# Patient Record
Sex: Male | Born: 2005 | Race: Black or African American | Hispanic: No | Marital: Single | Smoking: Never smoker
Health system: Southern US, Community
[De-identification: ages and names within clinical notes are randomized; demographics above are authoritative.]

## PROBLEM LIST (undated history)

## (undated) DIAGNOSIS — T7840XA Allergy, unspecified, initial encounter: Secondary | ICD-10-CM

## (undated) DIAGNOSIS — F909 Attention-deficit hyperactivity disorder, unspecified type: Secondary | ICD-10-CM

## (undated) DIAGNOSIS — F840 Autistic disorder: Secondary | ICD-10-CM

## (undated) DIAGNOSIS — L309 Dermatitis, unspecified: Secondary | ICD-10-CM

## (undated) HISTORY — DX: Dermatitis, unspecified: L30.9

## (undated) HISTORY — DX: Autistic disorder: F84.0

## (undated) HISTORY — DX: Allergy, unspecified, initial encounter: T78.40XA

## (undated) HISTORY — PX: ORCHIOPEXY: SUR915

---
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
## 2023-01-31 ENCOUNTER — Other Ambulatory Visit: Payer: Self-pay | Admitting: Family Medicine

## 2023-02-07 ENCOUNTER — Encounter: Payer: Self-pay | Admitting: Allergy and Immunology

## 2023-02-07 ENCOUNTER — Other Ambulatory Visit: Payer: Self-pay

## 2023-02-07 ENCOUNTER — Ambulatory Visit (INDEPENDENT_AMBULATORY_CARE_PROVIDER_SITE_OTHER): Payer: Medicaid Other | Admitting: Allergy and Immunology

## 2023-02-07 VITALS — BP 126/80 | HR 62 | Temp 98.0°F | Resp 18

## 2023-02-07 DIAGNOSIS — J4540 Moderate persistent asthma, uncomplicated: Secondary | ICD-10-CM

## 2023-02-07 DIAGNOSIS — J301 Allergic rhinitis due to pollen: Secondary | ICD-10-CM

## 2023-02-07 DIAGNOSIS — T781XXD Other adverse food reactions, not elsewhere classified, subsequent encounter: Secondary | ICD-10-CM

## 2023-02-07 DIAGNOSIS — J3089 Other allergic rhinitis: Secondary | ICD-10-CM | POA: Diagnosis not present

## 2023-02-07 DIAGNOSIS — L2089 Other atopic dermatitis: Secondary | ICD-10-CM

## 2023-02-07 MED ORDER — ALBUTEROL SULFATE (2.5 MG/3ML) 0.083% IN NEBU: 2.5000 mg | INHALATION_SOLUTION | RESPIRATORY_TRACT | 1 refills | Status: DC | PRN

## 2023-02-07 MED ORDER — EPIPEN 2-PAK 0.3 MG/0.3ML IJ SOAJ: 0.3000 mg | INTRAMUSCULAR | 1 refills | Status: DC | PRN

## 2023-02-07 MED ORDER — VENTOLIN HFA 108 (90 BASE) MCG/ACT IN AERS: INHALATION_SPRAY | RESPIRATORY_TRACT | 1 refills | Status: DC

## 2023-02-07 MED ORDER — LEVOCETIRIZINE DIHYDROCHLORIDE 5 MG PO TABS: 5.0000 mg | ORAL_TABLET | Freq: Every evening | ORAL | 5 refills | Status: DC

## 2023-02-07 MED ORDER — FLUTICASONE PROPIONATE HFA 110 MCG/ACT IN AERO: INHALATION_SPRAY | RESPIRATORY_TRACT | 5 refills | Status: DC

## 2023-02-07 MED ORDER — SYMBICORT 160-4.5 MCG/ACT IN AERO: 2.0000 | INHALATION_SPRAY | Freq: Two times a day (BID) | RESPIRATORY_TRACT | 5 refills | Status: DC

## 2023-02-07 MED ORDER — MOMETASONE FUROATE 0.1 % EX OINT: TOPICAL_OINTMENT | CUTANEOUS | 5 refills | Status: DC

## 2023-02-07 MED ORDER — FLUTICASONE PROPIONATE 50 MCG/ACT NA SUSP: 1.0000 | Freq: Every day | NASAL | 5 refills | Status: DC

## 2023-02-07 NOTE — Patient Instructions (Addendum)
  1. Continue Symbicort 160 - 2 inhalations 2 times per day    2. Continue Flonase 1-2 sprays each nostril 1 time a day  3. Continue levocetirizine 5 mg tablet 1 tablet 1 time per day  4. If Needed:  A. Pataday 1 drop each eye one time per day  B. Proventil HFA 2 puffs every 4-6 hours  C. Mometasone 0.1% ointment 1-2 times per day  D. EpiPen  5. "Action plan" for asthma flare up:   A. Continue Symbicort 2 times per day  B. Add Flovent 110 - 3 inhalations 3 times per day  C. Use Albuterol HFA if needed  6. Return to clinic in 6 months or sooner if problem

## 2023-02-07 NOTE — Progress Notes (Unsigned)
[place] - [place] - [place]   Follow-up Note  Referring Provider: Saddie Benders, MD Primary Provider: Saddie Benders, MD Date of Office Visit: 02/07/2023  Subjective:   Pedro Tucker (DOB: 2006-01-08) is a 17 y.o. male who returns to the [facility] [place] on 02/07/2023 in re-evaluation of the following:  HPI: Pedro Tucker returns to this clinic in evaluation of asthma, allergic rhinitis, atopic dermatitis, oral allergy syndrome.  I last saw him in this clinic 01 February 2022.  He has been seen by Dr. Posey Pronto on 16 December 2022.  During his last visit he appeared to have some form of secondary cutaneous infection complicating his atopic dermatitis involving for the most part his antecubital fossa for which he used topical mupirocin which resulted in resolution of this issue.  His asthma is doing pretty well.  He uses Symbicort twice a day.  Last week he did have to use a nebulizer with a bronchodilator as he did have some coughing but he has since calmed down regarding that issue.  For the most part he thinks his asthma is under good control.  His nose is stuffy and he has some sneezing Vino uses Flonase daily and takes Xyzal daily.  He is very careful about remaining away from fruits and vegetables that gives rise to oral itching.  Allergies as of 02/07/2023   No Known Allergies      Medication List    albuterol (2.5 MG/3ML) 0.083% nebulizer solution Commonly known as: PROVENTIL Take 3 mLs (2.5 mg total) by nebulization every 4 (four) hours as needed for wheezing or shortness of breath.   albuterol 108 (90 Base) MCG/ACT inhaler Commonly known as: Ventolin HFA Inhale 2 puffs every 4 hours as needed for cough or wheeze   budesonide-formoterol 160-4.5 MCG/ACT inhaler Commonly known as: Symbicort Inhale 2 puffs into the lungs 2 (two) times daily.   clindamycin 1 % Swab Commonly known as: CLEOCIN T Apply 1 each topically 2 (two) times daily.    EpiPen 2-Pak 0.3 mg/0.3 mL Soaj injection Generic drug: EPINEPHrine Inject 0.3 mg into the muscle as needed for anaphylaxis.   fluticasone 110 MCG/ACT inhaler Commonly known as: Flovent HFA Inhale 3 puffs into the lungs 3 (three) times daily. Can inhale three puffs three times daily during asthma flare-up as directed.  Rinse, gargle, and spit after use.   fluticasone 50 MCG/ACT nasal spray Commonly known as: FLONASE Place 2 sprays into both nostrils daily.   hydrocortisone 2.5 % cream Apply topically 2 (two) times daily. Apply twice daily for flare ups above neck, maximum 7 days.   levocetirizine 5 MG tablet Commonly known as: XYZAL Take 1 tablet (5 mg total) by mouth every evening.   montelukast 10 MG tablet Commonly known as: SINGULAIR Take 1 tablet (10 mg total) by mouth daily as needed (Can take an extra dose during flare ups.).   mupirocin ointment 2 % Commonly known as: BACTROBAN Use three times daily for 1-2 weeks.   Olopatadine HCl 0.2 % Soln Commonly known as: Pataday Place 1 drop into both eyes daily as needed (Can use an extra drop during flare ups.).    Past Medical History:  Diagnosis Date   Allergy    Asthma    Attention deficit hyperactivity disorder (ADHD)    Autism spectrum disorder    Eczema     Past Surgical History:  Procedure Laterality Date   ORCHIOPEXY      Review of systems  negative except as noted in HPI / PMHx or noted below:  Review of Systems  Constitutional: Negative.   HENT: Negative.    Eyes: Negative.   Respiratory: Negative.    Cardiovascular: Negative.   Gastrointestinal: Negative.   Genitourinary: Negative.   Musculoskeletal: Negative.   Skin: Negative.   Neurological: Negative.   Endo/Heme/Allergies: Negative.   Psychiatric/Behavioral: Negative.       Objective:   Vitals:   02/07/23 1533  BP: 126/80  Pulse: 62  Resp: 18  Temp: 98 F (36.7 C)  SpO2: 97%          Physical Exam Constitutional:       Appearance: He is not diaphoretic.  HENT:     Head: Normocephalic.     Right Ear: Tympanic membrane, ear canal and external ear normal.     Left Ear: Tympanic membrane, ear canal and external ear normal.     Nose: Mucosal edema present. No rhinorrhea.     Mouth/Throat:     Pharynx: Uvula midline. No oropharyngeal exudate.  Eyes:     Conjunctiva/sclera: Conjunctivae normal.  Neck:     Thyroid: No thyromegaly.     Trachea: Trachea normal. No tracheal tenderness or tracheal deviation.  Cardiovascular:     Rate and Rhythm: Normal rate and regular rhythm.     Heart sounds: Normal heart sounds, S1 normal and S2 normal. No murmur heard. Pulmonary:     Effort: No respiratory distress.     Breath sounds: Normal breath sounds. No stridor. No wheezing or rales.  Lymphadenopathy:     Head:     Right side of head: No tonsillar adenopathy.     Left side of head: No tonsillar adenopathy.     Cervical: No cervical adenopathy.  Skin:    Findings: No erythema or rash.     Nails: There is no clubbing.  Neurological:     Mental Status: He is alert.     Diagnostics:    Spirometry was performed and demonstrated an FEV1 of 3.01 at 72 % of predicted.  The patient had an Asthma Control Test with the following results: ACT Total Score: 19.    Assessment and Plan:   1. Asthma, moderate persistent, well-controlled   2. Perennial allergic rhinitis   3. Seasonal allergic rhinitis due to pollen   4. Other atopic dermatitis   5. Pollen-food allergy, subsequent encounter     1. Continue Symbicort 160 - 2 inhalations 2 times per day    2. Continue Flonase 1-2 sprays each nostril 1 time a day  3. Continue levocetirizine 5 mg tablet 1 tablet 1 time per day  4. If Needed:  A. Pataday 1 drop each eye one time per day  B. Proventil HFA 2 puffs every 4-6 hours  C. Mometasone 0.1% ointment 1-2 times per day  D. EpiPen  5. "Action plan" for asthma flare up:   A. Continue Symbicort 2 times per  day  B. Add Flovent 110 - 3 inhalations 3 times per day  C. Use Albuterol HFA if needed  6. Return to clinic in 6 months or sooner if problem  [place] certainly is having a little bit of problem as he goes through the pollen season which has been his issue for many many years.  We have tried various therapies in the past including immunotherapy and some biologic agents but logistically is hard for him to use those forms of therapy and he is now relying on a combination  of anti-inflammatory agents for his airway and his skin to address his atopic disease.  Assuming he does well with this plan I will see him back in this clinic in 6 months or earlier if there is a problem.  Allena Katz, MD Allergy / Immunology [place]

## 2023-02-08 ENCOUNTER — Encounter: Payer: Self-pay | Admitting: Allergy and Immunology

## 2023-07-20 ENCOUNTER — Other Ambulatory Visit (HOSPITAL_COMMUNITY): Payer: Self-pay

## 2023-07-20 ENCOUNTER — Telehealth: Payer: Self-pay

## 2023-07-21 NOTE — Telephone Encounter (Signed)
Since 2022 nothing has been mentioned about montelukast on any of the AVS' do you want to put pt back on it?

## 2023-07-25 MED ORDER — MONTELUKAST SODIUM 10 MG PO TABS: 10.0000 mg | ORAL_TABLET | Freq: Every day | ORAL | 5 refills | Status: DC

## 2023-07-25 NOTE — Addendum Note (Signed)
Addended by: Berna Bue on: 07/25/2023 09:51 AM   Modules accepted: Orders

## 2023-07-25 NOTE — Telephone Encounter (Signed)
Lm explaining I sent in refill ad they had any other questions to let us know.

## 2023-08-08 ENCOUNTER — Ambulatory Visit: Payer: MEDICAID | Admitting: Allergy and Immunology

## 2023-08-08 ENCOUNTER — Ambulatory Visit: Payer: Medicaid Other | Admitting: Allergy and Immunology

## 2023-08-10 ENCOUNTER — Other Ambulatory Visit: Payer: Self-pay | Admitting: Allergy and Immunology

## 2023-08-17 ENCOUNTER — Other Ambulatory Visit: Payer: Self-pay

## 2023-08-17 MED ORDER — LEVOCETIRIZINE DIHYDROCHLORIDE 5 MG PO TABS: 5.0000 mg | ORAL_TABLET | Freq: Every evening | ORAL | 0 refills | Status: DC

## 2023-08-21 ENCOUNTER — Other Ambulatory Visit: Payer: Self-pay | Admitting: Allergy and Immunology

## 2023-08-22 ENCOUNTER — Encounter: Payer: Self-pay | Admitting: Pediatrics

## 2023-08-22 ENCOUNTER — Ambulatory Visit (INDEPENDENT_AMBULATORY_CARE_PROVIDER_SITE_OTHER): Payer: MEDICAID | Admitting: Pediatrics

## 2023-08-22 VITALS — BP 132/74 | Ht 72.84 in | Wt 215.2 lb

## 2023-08-22 DIAGNOSIS — Z0001 Encounter for general adult medical examination with abnormal findings: Secondary | ICD-10-CM

## 2023-09-05 ENCOUNTER — Encounter: Payer: Self-pay | Admitting: Allergy and Immunology

## 2023-09-05 ENCOUNTER — Ambulatory Visit (INDEPENDENT_AMBULATORY_CARE_PROVIDER_SITE_OTHER): Payer: MEDICAID | Admitting: Allergy and Immunology

## 2023-09-05 VITALS — BP 126/82 | HR 71 | Temp 98.4°F | Resp 16 | Ht 72.25 in | Wt 209.5 lb

## 2023-09-05 DIAGNOSIS — J3089 Other allergic rhinitis: Secondary | ICD-10-CM | POA: Diagnosis not present

## 2023-09-05 DIAGNOSIS — J4540 Moderate persistent asthma, uncomplicated: Secondary | ICD-10-CM | POA: Diagnosis not present

## 2023-09-05 DIAGNOSIS — H1013 Acute atopic conjunctivitis, bilateral: Secondary | ICD-10-CM

## 2023-09-05 DIAGNOSIS — H1010 Acute atopic conjunctivitis, unspecified eye: Secondary | ICD-10-CM

## 2023-09-05 DIAGNOSIS — L738 Other specified follicular disorders: Secondary | ICD-10-CM

## 2023-09-05 DIAGNOSIS — J301 Allergic rhinitis due to pollen: Secondary | ICD-10-CM | POA: Diagnosis not present

## 2023-09-05 DIAGNOSIS — L2089 Other atopic dermatitis: Secondary | ICD-10-CM

## 2023-09-05 NOTE — Patient Instructions (Signed)
  1. Continue Symbicort 160 - 2 inhalations 2 times per day    2. Continue Flonase 1-2 sprays each nostril 1 time a day  3. Continue levocetirizine 5 mg tablet 1 tablet 1 time per day  4. If Needed:  A. Pataday 1 drop each eye one time per day  B. Albuterol + Fluticasone 110 - 2 inhalations TOGETHER every 4-6 hours  C. Mometasone 0.1% ointment 1-2 times per day to body D. Elidel 1-2 times per day to face  5. For under arm dermatitis:   A. Change under arm deodorant  B. Clotrimazole cream applied twice a day for 2 weeks  C. Further evaluation and treatment???  6. Return to clinic in 6 months or sooner if problem

## 2023-09-05 NOTE — Progress Notes (Unsigned)
[place] - [place] - [place] - [place] - [place]   Follow-up Note  Referring Provider: Lucio Edward, MD Primary Provider: Lucio Edward, MD Date of Office Visit: 09/05/2023  Subjective:   Pedro Tucker (DOB: [birth year]) is a 17 y.o. male who [facility]23 in re-evaluation of the following:  HPI: Pedro Tucker returns to this clinic in evaluation of asthma, allergic rhinitis, atopic dermatitis.  I last saw him in this clinic 07 February 2023.  He has had very little problems with his airway.  He uses his Symbicort and Flonase on a consistent basis and has very little problem with wheezing or coughing or the need to use the short acting bronchodilator and he has very little problems with nasal congestion and sneezing and he has not required a systemic steroid or an antibiotic for any type of airway issue.  But what has been an issue is his skin.  He has several different skin problems.  He has been developing some irritation on his forehead and his perinasal area and on his chest that is itchy and he does excoriate this area.  He has run out of his topical steroids.  He has been out of these agents for a month and a half.  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  And he has developed a relatively new dermatitis under his axilla bilaterally and this may correlate with the use of a certain type of arm deodorant.  Allergies as of 09/05/2023   No Known Allergies      Medication List    albuterol (2.5 MG/3ML) 0.083% nebulizer solution Commonly known as: PROVENTIL Take 3 mLs (2.5 mg total) by nebulization every 4 (four) hours as needed for wheezing or shortness of breath.   Ventolin HFA 108 (90 Base) MCG/ACT inhaler Generic drug: albuterol Inhale 2 puffs every 4 hours as needed  for cough or wheeze   clindamycin 1 % Swab Commonly known as: CLEOCIN T Apply 1 each topically 2 (two) times daily.   fluticasone 110 MCG/ACT inhaler Commonly known as: Flovent HFA Inhale 3 puffs into the lungs 3 (three) times daily. Can inhale three puffs three times daily during asthma flare-up as directed.  Rinse, gargle, and spit after use.   fluticasone 50 MCG/ACT nasal spray Commonly known as: FLONASE Place 1-2 sprays into both nostrils daily.   hydrocortisone 2.5 % cream Apply topically 2 (two) times daily. Apply twice daily for flare ups above neck, maximum 7 days.   levocetirizine 5 MG tablet Commonly known as: XYZAL TAKE 1 TABLET BY MOUTH EVERY EVENING   mometasone 0.1 % ointment Commonly known as: ELOCON 1 application 1-2 times daily as needed.   montelukast 10 MG tablet Commonly known as: SINGULAIR Take 1 tablet (10 mg total) by mouth daily as needed (Can take an extra dose during flare ups.).   mupirocin ointment 2 % Commonly known as: BACTROBAN Use three times daily for 1-2 weeks.   Olopatadine HCl 0.2 % Soln Commonly known as: Pataday Place 1 drop into both eyes daily as needed (Can use an extra drop during flare ups.).   Symbicort 160-4.5 MCG/ACT inhaler Generic drug: budesonide-formoterol Inhale 2 puffs into the lungs 2 (two) times daily.    Past Medical History:  Diagnosis Date   Allergy  Asthma    Attention deficit hyperactivity disorder (ADHD)    Autism spectrum disorder    Eczema     Past Surgical History:  Procedure Laterality Date   ORCHIOPEXY      Review of systems negative except as noted in HPI / PMHx or noted below:  Review of Systems  Constitutional: Negative.   HENT: Negative.    Eyes: Negative.   Respiratory: Negative.    Cardiovascular: Negative.   Gastrointestinal: Negative.   Genitourinary: Negative.   Musculoskeletal: Negative.   Skin: Negative.   Neurological: Negative.   Endo/Heme/Allergies: Negative.    Psychiatric/Behavioral: Negative.       Objective:   Vitals:   09/05/23 1634  BP: 126/82  Pulse: 71  Resp: 16  Temp: 98.4 F (36.9 C)  SpO2: 99%   Height: 6' 0.25" (183.5 cm)  Weight: 209 lb 8 oz (95 kg)   Physical Exam Constitutional:      Appearance: He is not diaphoretic.  HENT:     Head: Normocephalic.     Right Ear: Tympanic membrane, ear canal and external ear normal.     Left Ear: Tympanic membrane, ear canal and external ear normal.     Nose: Nose normal. No mucosal edema or rhinorrhea.     Mouth/Throat:     Pharynx: Uvula midline. No oropharyngeal exudate.  Eyes:     Conjunctiva/sclera: Conjunctivae normal.  Neck:     Thyroid: No thyromegaly.     Trachea: Trachea normal. No tracheal tenderness or tracheal deviation.  Cardiovascular:     Rate and Rhythm: Normal rate and regular rhythm.     Heart sounds: Normal heart sounds, S1 normal and S2 normal. No murmur heard. Pulmonary:     Effort: No respiratory distress.     Breath sounds: Normal breath sounds. No stridor. No wheezing or rales.  Lymphadenopathy:     Head:     Right side of head: No tonsillar adenopathy.     Left side of head: No tonsillar adenopathy.     Cervical: No cervical adenopathy.  Skin:    Findings: Rash (hyperpigmentation axilla bilaterally, follicular hyperpgmented lesions upper arms + anterior neck / beard area, erythema + sl scale face.) present. No erythema.     Nails: There is no clubbing.  Neurological:     Mental Status: He is alert.     Diagnostics: none  Assessment and Plan:   1. Asthma, moderate persistent, well-controlled   2. Perennial allergic rhinitis   3. Seasonal allergic rhinitis due to pollen   4. Seasonal allergic conjunctivitis   5. Other atopic dermatitis   6. Folliculitis barbae    1. Continue Symbicort 160 - 2 inhalations 2 times per day    2. Continue Flonase 1-2 sprays each nostril 1 time a day  3. Continue levocetirizine 5 mg tablet 1 tablet 1 time  per day  4. If Needed:  A. Pataday 1 drop each eye one time per day  B. Albuterol + Fluticasone 110 - 2 inhalations TOGETHER every 4-6 hours  C. Mometasone 0.1% ointment 1-2 times per day to body D. Elidel 1-2 times per day to face  5. For under arm dermatitis:   A. Change under arm deodorant  B. Clotrimazole cream applied twice a day for 2 weeks  C. Further evaluation and treatment???  6. Return to clinic in 6 months or sooner if problem  Climmie is doing very well from a respiratory standpoint and he will continue on his anti-inflammatory  medicines for his airway as noted above.  He is not doing as well with his skin.  He has a combination of atopic dermatitis, folliculitis, and what is probably a contact dermatitis from his underarm deodorant but may also be a secondary fungal infection.  We are going to address his skin issues by having him use Elidel to his face, mometasone to his body, and clotrimazole 1 under his axilla while he eliminates his current underarm deodorant.  His mom will keep in contact with me noting his response to this approach.  If he does well I will see him back in this clinic in 6 months or earlier if there is a problem.  Laurette Schimke, MD Allergy / Immunology [facility]

## 2023-09-06 ENCOUNTER — Encounter: Payer: Self-pay | Admitting: Allergy and Immunology

## 2023-09-06 MED ORDER — VENTOLIN HFA 108 (90 BASE) MCG/ACT IN AERS: 2.0000 | INHALATION_SPRAY | RESPIRATORY_TRACT | 1 refills | Status: DC | PRN

## 2023-09-06 MED ORDER — HYDROCORTISONE 2.5 % EX CREA: TOPICAL_CREAM | Freq: Two times a day (BID) | CUTANEOUS | 1 refills | Status: DC

## 2023-09-06 MED ORDER — FLUTICASONE PROPIONATE HFA 110 MCG/ACT IN AERO: 3.0000 | INHALATION_SPRAY | Freq: Three times a day (TID) | RESPIRATORY_TRACT | 1 refills | Status: DC

## 2023-09-06 MED ORDER — FLUTICASONE PROPIONATE 50 MCG/ACT NA SUSP: 1.0000 | Freq: Every day | NASAL | 1 refills | Status: DC

## 2023-09-06 MED ORDER — MOMETASONE FUROATE 0.1 % EX OINT: TOPICAL_OINTMENT | Freq: Every day | CUTANEOUS | 1 refills | Status: DC

## 2023-09-06 MED ORDER — OLOPATADINE HCL 0.2 % OP SOLN: 1.0000 [drp] | Freq: Every day | OPHTHALMIC | 1 refills | Status: DC | PRN

## 2023-09-06 MED ORDER — ALBUTEROL SULFATE (2.5 MG/3ML) 0.083% IN NEBU: 2.5000 mg | INHALATION_SOLUTION | RESPIRATORY_TRACT | 1 refills | Status: AC | PRN

## 2023-09-06 MED ORDER — MONTELUKAST SODIUM 10 MG PO TABS: 10.0000 mg | ORAL_TABLET | Freq: Every day | ORAL | 1 refills | Status: DC | PRN

## 2023-09-06 MED ORDER — EPIPEN 2-PAK 0.3 MG/0.3ML IJ SOAJ: 0.3000 mg | INTRAMUSCULAR | 1 refills | Status: DC | PRN

## 2023-09-06 MED ORDER — SYMBICORT 160-4.5 MCG/ACT IN AERO: 2.0000 | INHALATION_SPRAY | Freq: Two times a day (BID) | RESPIRATORY_TRACT | 1 refills | Status: DC

## 2023-09-06 MED ORDER — MUPIROCIN 2 % EX OINT: TOPICAL_OINTMENT | CUTANEOUS | 1 refills | Status: DC

## 2023-09-06 MED ORDER — LEVOCETIRIZINE DIHYDROCHLORIDE 5 MG PO TABS: 5.0000 mg | ORAL_TABLET | Freq: Every day | ORAL | 1 refills | Status: DC | PRN

## 2023-09-07 ENCOUNTER — Telehealth: Payer: Self-pay | Admitting: Allergy and Immunology

## 2023-09-07 MED ORDER — EPINEPHRINE 0.3 MG/0.3ML IJ SOAJ: 0.3000 mg | INTRAMUSCULAR | 1 refills | Status: AC | PRN

## 2023-09-07 NOTE — Telephone Encounter (Signed)
[facility]

## 2023-09-07 NOTE — Telephone Encounter (Signed)
Patient's pharmacy called stating they are not able to get the brand name Epi Pen. The pharmacy needs the okay to fill the generic Epi Pen.

## 2023-09-12 NOTE — Progress Notes (Signed)
Adolescent Well Care Visit Pedro Tucker is a 17 y.o. male who is here for well care.    PCP:  Lucio Edward, MD   History was provided by the patient and mother.  Confidentiality was discussed with the patient and, if applicable, with caregiver as well. Patient's personal or confidential phone number:    Current Issues: Current concerns include rash inside of the arms.  Present for the past 1 months time..   Nutrition: Nutrition/Eating Behaviors: Eating a varied diet Adequate calcium in diet?:  Yes Supplements/ Vitamins: No  Exercise/ Media: Play any Sports?/ Exercise: Walks every day.   Screen Time:  > 2 hours-counseling provided Media Rules or Monitoring?: no  Sleep:  Sleep: 6 to 7 hours  Social Screening: Lives with: Mother Parental relations:  good Activities, Work, and Regulatory affairs officer?:  Chores at home Concerns regarding behavior with peers?  Patient homeschooled Stressors of note: no  Education: School Name: [employer] Grade: Graduated School performance: doing well; no concerns School Behavior: Not applicable  Menstruation:   No LMP for male patient. Menstrual History: Not applicable  Confidential Social History: Tobacco?  no Secondhand smoke exposure?  no Drugs/ETOH?  no  Sexually Active?  no   Pregnancy Prevention: Not applicable  Safe at home, in school & in relationships?  Yes Safe to self?  Yes   Screenings: Patient has a dental home: yes  PHQ-9 completed and results indicated no concerns  Physical Exam:  Vitals:   08/22/23 1337  BP: 132/74  Weight: 215 lb 4 oz (97.6 kg)  Height: 6' 0.84" (1.85 m)   BP 132/74   Ht 6' 0.84" (1.85 m)   Wt 215 lb 4 oz (97.6 kg)   BMI 28.53 kg/m  Body mass index: body mass index is 28.53 kg/m. Blood pressure %iles are not available for patients who are 18 years or older.  Hearing Screening   500Hz  1000Hz  2000Hz  3000Hz  4000Hz   Right ear 25 20 20 20 20   Left ear 25 20 20 20 20    Vision Screening    Right eye Left eye Both eyes  Without correction     With correction 20/20 20/20 20/20     General Appearance:   alert, oriented, no acute distress, well nourished, and poor eye contact.  Staccato speech  HENT: Normocephalic, no obvious abnormality, conjunctiva clear  Mouth:   Normal appearing teeth, no obvious discoloration, dental caries, or dental caps  Neck:   Supple; thyroid: no enlargement, symmetric, no tenderness/mass/nodules  Chest Normal male  Lungs:   Clear to auscultation bilaterally, normal work of breathing  Heart:   Regular rate and rhythm, S1 and S2 normal, no murmurs;   Abdomen:   Soft, non-tender, no mass, or organomegaly  GU Declined examination  Musculoskeletal:   Tone and strength strong and symmetrical, all extremities               Lymphatic:   No cervical adenopathy  Skin/Hair/Nails:   Skin warm, dry and intact, no rashes, no bruises or petechiae, folliculitis on arms.  Hyperpigmentation in the axilla area secondary to irritation.  Neurologic:   Strength, gait, and coordination normal and age-appropriate     Assessment and Plan:   1.  Well-child check 2.  Autism spectrum 3.  Folliculitis 4.  Patient with diagnosis of autism, who no longer wants to continue on with schooling.  Mother feels that he would do well to do so, however she states that the patient is always "in and  out" when it comes to academics.  He sometimes wants to pay attention sometimes not.  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 Patient to transition to an adult practice.  BMI is appropriate for age  Hearing screening result:normal Vision screening result: normal  Counseling provided for all of the vaccine components No orders of the defined types were placed in this encounter. Patient declined immunizations.   No follow-ups on file.Lucio Edward, MD

## 2023-09-15 ENCOUNTER — Ambulatory Visit: Payer: Self-pay | Admitting: Pediatrics

## 2023-09-20 ENCOUNTER — Ambulatory Visit (INDEPENDENT_AMBULATORY_CARE_PROVIDER_SITE_OTHER): Payer: Self-pay | Admitting: Pediatrics

## 2023-09-20 ENCOUNTER — Encounter: Payer: Self-pay | Admitting: Pediatrics

## 2023-09-20 VITALS — BP 124/70 | Ht 72.52 in | Wt 206.0 lb

## 2023-09-20 DIAGNOSIS — R030 Elevated blood-pressure reading, without diagnosis of hypertension: Secondary | ICD-10-CM

## 2023-09-25 NOTE — Progress Notes (Signed)
Patient here for blood pressure recheck.  Blood pressure at 124/70 which is much improved from office visit on October 8.  Recommend recheck of at least 2 more blood pressures.

## 2023-10-05 ENCOUNTER — Telehealth: Payer: Self-pay

## 2023-10-05 ENCOUNTER — Telehealth: Payer: Self-pay | Admitting: Pediatrics

## 2023-10-05 NOTE — Telephone Encounter (Signed)
Mother called with concerns for patient. There are two boils under the left arm and one boil under his right arm. Patient states it is painful to raise his arms up. Mother requests a call back for advice on possible treatments.   Please advise. Thank you!

## 2023-10-05 NOTE — Telephone Encounter (Signed)
Called mom and spoke with her about the boils under Pedro Tucker's arms. Mom states that Dr.Kozlow prescribed him the medication on 09/05/2023. Per Dr.Gosrani patient needs to call his office and see what they recommend since he was the one that prescribed the medication, but if the patient is in pain and unable to raise his arms he should be evaluated. Mom understood instructions and states she was going to call Dr.Kozlow's office and see what they suggest. She said thanks for the call back.

## 2023-10-05 NOTE — Telephone Encounter (Signed)
Patient's mom called stating the patient has boils under both of his arm pits. They started coming up on Monday. Mom is wondering if . Clotrimazole cream can cause these boils & what she can do to treat them.    [facility]

## 2023-10-07 ENCOUNTER — Telehealth: Payer: Self-pay | Admitting: Allergy

## 2023-10-07 NOTE — Telephone Encounter (Signed)
Mom called about boils under his armpits at 10:15PM on Saturday.  She tried to potato under both of his arms?   He tried warm compresses and taking aleve.   Apparently this started after using clotrimazole cream for 1 month.  No fevers, no drainage, no erythema. Not warm to touch. Just painful bumps.   Mom tried to call office on earlier this past week. He is currently using a different type of deodorant - Hello.  Told mom to use Hibiclens wash for now and NOT to put anything else underarms.  Will route to Dr. Lucie Leather to see what plan he had in mind.  Told mom hard to diagnose what it could be over the phone without seeing it.

## 2023-10-09 MED ORDER — CLINDAMYCIN HCL 150 MG PO CAPS: 150.0000 mg | ORAL_CAPSULE | Freq: Three times a day (TID) | ORAL | 0 refills | Status: AC

## 2023-10-09 NOTE — Telephone Encounter (Signed)
Spoke with mom and informed her of Dr. Kathyrn Lass recommendation. Mom verbalized understanding. Prescription has been sent to the requested pharmacy.

## 2023-10-09 NOTE — Addendum Note (Signed)
Addended by: Dub Mikes on: 10/09/2023 03:46 PM   Modules accepted: Orders

## 2023-10-09 NOTE — Telephone Encounter (Signed)
Patient has been schedule to come in tomorrow 10/10/2023 at 11:30 to see Dr. Lucie Leather.

## 2023-10-09 NOTE — Telephone Encounter (Signed)
Please see Dr. Johney Maine message below.

## 2023-10-10 ENCOUNTER — Ambulatory Visit (INDEPENDENT_AMBULATORY_CARE_PROVIDER_SITE_OTHER): Payer: MEDICAID | Admitting: Allergy and Immunology

## 2023-10-10 ENCOUNTER — Encounter: Payer: Self-pay | Admitting: Allergy and Immunology

## 2023-10-10 ENCOUNTER — Other Ambulatory Visit: Payer: Self-pay

## 2023-10-10 VITALS — BP 128/82 | HR 72 | Temp 97.9°F | Ht 72.52 in | Wt 202.0 lb

## 2023-10-10 DIAGNOSIS — J4540 Moderate persistent asthma, uncomplicated: Secondary | ICD-10-CM

## 2023-10-10 DIAGNOSIS — H1010 Acute atopic conjunctivitis, unspecified eye: Secondary | ICD-10-CM

## 2023-10-10 DIAGNOSIS — H1013 Acute atopic conjunctivitis, bilateral: Secondary | ICD-10-CM

## 2023-10-10 DIAGNOSIS — J3089 Other allergic rhinitis: Secondary | ICD-10-CM | POA: Diagnosis not present

## 2023-10-10 DIAGNOSIS — J301 Allergic rhinitis due to pollen: Secondary | ICD-10-CM

## 2023-10-10 DIAGNOSIS — L2089 Other atopic dermatitis: Secondary | ICD-10-CM

## 2023-10-10 DIAGNOSIS — L739 Follicular disorder, unspecified: Secondary | ICD-10-CM | POA: Diagnosis not present

## 2023-10-10 MED ORDER — CHLORHEXIDINE GLUCONATE 4 % EX SOLN: Freq: Two times a day (BID) | CUTANEOUS | 1 refills | Status: DC

## 2023-10-10 MED ORDER — ELIDEL 1 % EX CREA: TOPICAL_CREAM | CUTANEOUS | 5 refills | Status: DC

## 2023-10-10 MED ORDER — VENTOLIN HFA 108 (90 BASE) MCG/ACT IN AERS: 2.0000 | INHALATION_SPRAY | RESPIRATORY_TRACT | 1 refills | Status: DC | PRN

## 2023-10-10 MED ORDER — FLUTICASONE PROPIONATE HFA 110 MCG/ACT IN AERO: 3.0000 | INHALATION_SPRAY | Freq: Three times a day (TID) | RESPIRATORY_TRACT | 1 refills | Status: DC

## 2023-10-10 MED ORDER — SYMBICORT 160-4.5 MCG/ACT IN AERO: 2.0000 | INHALATION_SPRAY | Freq: Two times a day (BID) | RESPIRATORY_TRACT | 1 refills | Status: DC

## 2023-10-10 MED ORDER — FLUTICASONE PROPIONATE 50 MCG/ACT NA SUSP: 1.0000 | Freq: Every day | NASAL | 1 refills | Status: DC

## 2023-10-10 MED ORDER — MOMETASONE FUROATE 0.1 % EX OINT: TOPICAL_OINTMENT | Freq: Every day | CUTANEOUS | 1 refills | Status: DC

## 2023-10-10 MED ORDER — CLINDAMYCIN HCL 150 MG PO CAPS: 150.0000 mg | ORAL_CAPSULE | Freq: Three times a day (TID) | ORAL | 0 refills | Status: AC

## 2023-10-10 MED ORDER — LEVOCETIRIZINE DIHYDROCHLORIDE 5 MG PO TABS: 5.0000 mg | ORAL_TABLET | Freq: Every day | ORAL | 1 refills | Status: DC | PRN

## 2023-10-10 NOTE — Patient Instructions (Signed)
  1. Continue Symbicort 160 - 2 inhalations 2 times per day    2. Continue Flonase 1-2 sprays each nostril 1 time a day  3. Continue levocetirizine 5 mg tablet 1 tablet 1 time per day  4. If Needed:  A. Pataday 1 drop each eye one time per day  B. Albuterol + Fluticasone 110 - 2 inhalations TOGETHER every 4-6 hours  C. Mometasone 0.1% ointment 1-2 times per day to body D. Elidel 1-2 times per day to face  5. For under arm infection   A. Clindamycin 150 - 1 tablet 3 times per day for 10 days  B. Warm compresses and NSAID if needed  C. Hibiclens twice a day  D. Further treatment??? Yes, if persistent  6. Return to clinic in 6 months or sooner if problem

## 2023-10-10 NOTE — Progress Notes (Unsigned)
[place] - [place] - [place] - [place] - [place]   Follow-up Note  Referring Provider: Lucio Edward, MD Primary Provider: Lucio Edward, MD Date of Office Visit: 10/10/2023  Subjective:   Pedro Tucker (DOB: 04-03-2006) is a 17 y.o. male who [facility] in re-evaluation of the following:  HPI: Carly returns to this clinic in evaluation of asthma, allergic rhinitis, atopic dermatitis, and axilla dermatitis.  I last saw him in this clinic 05 September 2023.  When I last saw him in this clinic he was having a dermatitis affecting his axilla that appeared to correlate with the use of a new type of underarm deodorant.  His exam at that point in time identified a fair amount of hyperpigmentation on his axilla bilaterally.  We treated him with his anti-inflammatory topical agents and is well added in clotrimazole for 2 weeks.  Although some of the hyperpigmentation went away he now developed a hard spot on his right axilla that can occasionally be painful.  His mom contact me by telephone on 05 October 2023 at which point in time we prescribed clindamycin and Hibiclens washes but unfortunately he did not take his clindamycin.  Allergies as of 10/10/2023   No Known Allergies      Medication List    albuterol (2.5 MG/3ML) 0.083% nebulizer solution Commonly known as: PROVENTIL Take 3 mLs (2.5 mg total) by nebulization every 4 (four) hours as needed for wheezing or shortness of breath.   Ventolin HFA 108 (90 Base) MCG/ACT inhaler Generic drug: albuterol Inhale 2 puffs into the lungs every 4 (four) hours as needed for wheezing or shortness of breath. Inhale 2 puffs every 4 hours as needed for cough or wheeze   clindamycin 1 % Swab Commonly known as: CLEOCIN T Apply 1 each topically 2 (two) times daily.   clindamycin 150 MG capsule Commonly known as: CLEOCIN Take 1 capsule (150 mg total) by mouth 3 (three) times daily for 10 days.    EPINEPHrine 0.3 mg/0.3 mL Soaj injection Commonly known as: EpiPen 2-Pak Inject 0.3 mg into the muscle as needed for anaphylaxis.   fluticasone 110 MCG/ACT inhaler Commonly known as: Flovent HFA Inhale 3 puffs into the lungs 3 (three) times daily. Can inhale three puffs three times daily during asthma flare-up as directed.  Rinse, gargle, and spit after use.   fluticasone 50 MCG/ACT nasal spray Commonly known as: FLONASE Place 1-2 sprays into both nostrils daily.   hydrocortisone 2.5 % cream Apply topically 2 (two) times daily. Apply twice daily for flare ups above neck, maximum 7 days.   levocetirizine 5 MG tablet Commonly known as: XYZAL Take 1 tablet (5 mg total) by mouth daily as needed for allergies (Can takre an extra dose during flare ups).   mometasone 0.1 % ointment Commonly known as: ELOCON Apply topically daily. 1 application 1-2 times daily as needed.   montelukast 10 MG tablet Commonly known as: SINGULAIR Take 1 tablet (10 mg total) by mouth daily as needed (Can take an extra dose during flare ups.).   mupirocin ointment 2 % Commonly known as: BACTROBAN Use three times daily for 1-2 weeks.   Olopatadine HCl 0.2 % Soln Commonly known as: Pataday Place 1 drop into both eyes daily as needed (Can use an extra drop during flare ups.).   Symbicort 160-4.5 MCG/ACT inhaler Generic drug: budesonide-formoterol Inhale 2 puffs into the lungs 2 (two) times daily.     Past Medical  History:  Diagnosis Date   Allergy    Asthma    Attention deficit hyperactivity disorder (ADHD)    Autism spectrum disorder    Eczema     Past Surgical History:  Procedure Laterality Date   ORCHIOPEXY      Review of systems negative except as noted in HPI / PMHx or noted below:  Review of Systems  Constitutional: Negative.   HENT: Negative.    Eyes: Negative.   Respiratory: Negative.    Cardiovascular: Negative.   Gastrointestinal: Negative.   Genitourinary: Negative.    Musculoskeletal: Negative.   Skin: Negative.   Neurological: Negative.   Endo/Heme/Allergies: Negative.   Psychiatric/Behavioral: Negative.       Objective:   Vitals:   10/10/23 1130 10/10/23 1142  BP: (!) 140/90 (!) 140/82  Pulse: 72   Temp: 97.9 F (36.6 C)   SpO2: 97%    Height: 6' 0.52" (184.2 cm)  Weight: 202 lb (91.6 kg)   Physical Exam Skin:    Findings: Rash (hyperpigmented axilla bilaterally. 0.5 cm mobile somewhat tender SQ nodule without erythema right axilla) present.     Diagnostics: none  Assessment and Plan:   1. Acute folliculitis   2. Asthma, moderate persistent, well-controlled   3. Perennial allergic rhinitis   4. Seasonal allergic rhinitis due to pollen   5. Seasonal allergic conjunctivitis   6. Other atopic dermatitis    1. Continue Symbicort 160 - 2 inhalations 2 times per day    2. Continue Flonase 1-2 sprays each nostril 1 time a day  3. Continue levocetirizine 5 mg tablet 1 tablet 1 time per day  4. If Needed:  A. Pataday 1 drop each eye one time per day  B. Albuterol + Fluticasone 110 - 2 inhalations TOGETHER every 4-6 hours  C. Mometasone 0.1% ointment 1-2 times per day to body D. Elidel 1-2 times per day to face  5. For under arm infection   A. Clindamycin 150 - 1 tablet 3 times per day for 10 days  B. Warm compresses and NSAID if needed  C. Hibiclens twice a day  D. Further treatment??? Yes, if persistent  6. Return to clinic in 6 months or sooner if problem  Toretto appears to once again has developed some folliculitis and possible carbuncle formation and we will treat him with a broad-spectrum antibiotic with clindamycin and have him perform some local therapy and if this does not resolve then we will refer him on for a more invasive form of therapy.    Laurette Schimke, MD Allergy / Immunology [facility]

## 2023-10-11 ENCOUNTER — Encounter: Payer: Self-pay | Admitting: Allergy and Immunology

## 2023-10-26 ENCOUNTER — Other Ambulatory Visit (HOSPITAL_COMMUNITY): Payer: Self-pay

## 2023-10-26 ENCOUNTER — Telehealth: Payer: Self-pay

## 2023-10-26 NOTE — Telephone Encounter (Signed)
Pharmacy Patient Advocate Encounter   Received notification from CoverMyMeds that prior authorization for Elidel 1% cream is required/requested.   Insurance verification completed.   The patient is insured through [facility] .   Per test claim: PA required; PA submitted to above mentioned insurance via CoverMyMeds Key/confirmation #/EOC [id number] Status is pending

## 2023-10-27 NOTE — Telephone Encounter (Signed)
Pharmacy Patient Advocate Encounter  Received notification from [facility] that Prior Authorization for  Elidel 1% cream has been APPROVED from 10-26-2023 to 10-25-2024   PA [facility] ID/[facility]

## 2024-02-05 ENCOUNTER — Other Ambulatory Visit: Payer: Self-pay | Admitting: Allergy and Immunology

## 2024-02-06 ENCOUNTER — Other Ambulatory Visit: Payer: Self-pay | Admitting: Allergy and Immunology

## 2024-02-07 NOTE — Telephone Encounter (Signed)
 Refill for Singulair 10 mg x 60 day supply with no refills sent to [facility].

## 2024-03-01 ENCOUNTER — Other Ambulatory Visit: Payer: Self-pay | Admitting: Allergy and Immunology

## 2024-03-05 ENCOUNTER — Ambulatory Visit (INDEPENDENT_AMBULATORY_CARE_PROVIDER_SITE_OTHER): Payer: MEDICAID | Admitting: Allergy and Immunology

## 2024-03-05 VITALS — BP 118/70 | HR 67 | Temp 98.5°F | Resp 18 | Ht 71.65 in | Wt 203.1 lb

## 2024-03-05 DIAGNOSIS — J4540 Moderate persistent asthma, uncomplicated: Secondary | ICD-10-CM | POA: Diagnosis not present

## 2024-03-05 DIAGNOSIS — L738 Other specified follicular disorders: Secondary | ICD-10-CM

## 2024-03-05 DIAGNOSIS — H1010 Acute atopic conjunctivitis, unspecified eye: Secondary | ICD-10-CM

## 2024-03-05 DIAGNOSIS — J301 Allergic rhinitis due to pollen: Secondary | ICD-10-CM | POA: Diagnosis not present

## 2024-03-05 DIAGNOSIS — J3089 Other allergic rhinitis: Secondary | ICD-10-CM

## 2024-03-05 DIAGNOSIS — H1013 Acute atopic conjunctivitis, bilateral: Secondary | ICD-10-CM

## 2024-03-05 DIAGNOSIS — L2089 Other atopic dermatitis: Secondary | ICD-10-CM

## 2024-03-05 MED ORDER — MUPIROCIN 2 % EX OINT: TOPICAL_OINTMENT | CUTANEOUS | 1 refills | Status: DC

## 2024-03-05 MED ORDER — SYMBICORT 160-4.5 MCG/ACT IN AERO: 2.0000 | INHALATION_SPRAY | Freq: Two times a day (BID) | RESPIRATORY_TRACT | 1 refills | Status: DC

## 2024-03-05 MED ORDER — LEVOCETIRIZINE DIHYDROCHLORIDE 5 MG PO TABS: 5.0000 mg | ORAL_TABLET | Freq: Every day | ORAL | 1 refills | Status: DC | PRN

## 2024-03-05 MED ORDER — MOMETASONE FUROATE 0.1 % EX OINT: TOPICAL_OINTMENT | Freq: Every day | CUTANEOUS | 5 refills | Status: DC

## 2024-03-05 MED ORDER — CHLORHEXIDINE GLUCONATE 4 % EX SOLN: Freq: Two times a day (BID) | CUTANEOUS | 1 refills | Status: DC

## 2024-03-05 MED ORDER — ELIDEL 1 % EX CREA: TOPICAL_CREAM | CUTANEOUS | 5 refills | Status: DC

## 2024-03-05 MED ORDER — OLOPATADINE HCL 0.2 % OP SOLN: 1.0000 [drp] | Freq: Every day | OPHTHALMIC | 2 refills | Status: DC | PRN

## 2024-03-05 MED ORDER — MONTELUKAST SODIUM 10 MG PO TABS: 10.0000 mg | ORAL_TABLET | Freq: Every day | ORAL | 5 refills | Status: DC

## 2024-03-05 NOTE — Progress Notes (Signed)
 [place] - [place] - [place] - [place] - [place]   Follow-up Note  Referring Provider: Camilla Cedar, MD Primary Provider: Camilla Cedar, MD Date of Office Visit: 03/05/2024  Subjective:   Pedro Tucker (DOB: 2006-03-11) is a 18 y.o. male who [facility] in re-evaluation of the following:  HPI: Pedro Tucker returns to this clinic in evaluation of asthma, allergic rhinitis, atopic dermatitis, folliculitis.  I last saw him in this clinic 10 October 2023.  He is doing better regarding his skin condition.  Currently he is using hydrocortisone  to his face and this is resulting in very good control of his beard folliculitis surprisingly.  He did use mupirocin  for a period in time but no longer requires that topical agent.  He continues to apply mupirocin  under his axilla every day and that is working very well for his axilla dermatitis/folliculitis as well as changing out his antiperspirant.  He still continues to have multiple areas of hyperpigmented papules that have been in a stable pattern for many months affecting his chest but he is developing some new red papules on his arms even though he is using some topical mometasone  on his arms.  Most of these papules involve his anterior upper arm.  During this pollination season these he has been having problems with nasal congestion and sneezing and very itchy eyes especially when he goes outdoors.  This occurs even though he has been using Flonase  and levo cetirizine .  He has had very little problems with his asthma while using Symbicort  mostly 1 time per day and he also uses Symbicort  as his anti-inflammatory rescue medicine.  Allergies as of 03/05/2024   No Known Allergies      Medication List    albuterol  (2.5 MG/3ML) 0.083% nebulizer solution Commonly known as: PROVENTIL  Take 3 mLs (2.5 mg total) by nebulization every 4 (four) hours as needed for wheezing or shortness of breath.    Ventolin  HFA 108 (90 Base) MCG/ACT inhaler Generic drug: albuterol  inhale 2 PUFFS into THE lungs EVERY 4 HOURS AS NEEDED FOR cough OF wheeze   chlorhexidine  4 % external liquid Commonly known as: Hibiclens  Apply topically in the morning and at bedtime.   clindamycin  1 % Swab Commonly known as: CLEOCIN  T Apply 1 each topically 2 (two) times daily.   Elidel  1 % cream Generic drug: pimecrolimus  1 application 1-2 times daily as needed to face.   EPINEPHrine  0.3 mg/0.3 mL Soaj injection Commonly known as: EpiPen  2-Pak Inject 0.3 mg into the muscle as needed for anaphylaxis.   fluticasone  110 MCG/ACT inhaler Commonly known as: Flovent  HFA Inhale 3 puffs into the lungs 3 (three) times daily. Can inhale three puffs three times daily during asthma flare-up as directed.  Rinse, gargle, and spit after use.   fluticasone  50 MCG/ACT nasal spray Commonly known as: FLONASE  Place 1-2 sprays into both nostrils daily.   hydrocortisone  2.5 % cream Apply topically 2 (two) times daily. Apply twice daily for flare ups above neck, maximum 7 days.   levocetirizine 5 MG tablet Commonly known as: XYZAL  Take 1 tablet (5 mg total) by mouth daily as needed for allergies (Can takre an extra dose during flare ups).   mometasone  0.1 % ointment Commonly known as: ELOCON  apply 1 application 1-2 TIMES DAILY AS NEEDED   montelukast  10 MG tablet Commonly known as: SINGULAIR  TAKE 1 TABLET BY MOUTH ONCE DAILY AS NEEDED (Can take an extra DOSE during flare ups.).  mupirocin  ointment 2 % Commonly known as: BACTROBAN  APPLY TO AFFECTED AREA 3 TIMES DAILY FOR 1-2 WEEKS   Olopatadine  HCl 0.2 % Soln Commonly known as: Pataday  Place 1 drop into both eyes daily as needed (Can use an extra drop during flare ups.).   Symbicort  160-4.5 MCG/ACT inhaler Generic drug: budesonide -formoterol  Inhale 2 puffs into the lungs 2 (two) times daily.    Past Medical History:  Diagnosis Date   Allergy    Asthma     Attention deficit hyperactivity disorder (ADHD)    Autism spectrum disorder    Eczema     Past Surgical History:  Procedure Laterality Date   ORCHIOPEXY      Review of systems negative except as noted in HPI / PMHx or noted below:  Review of Systems  Constitutional: Negative.   HENT: Negative.    Eyes: Negative.   Respiratory: Negative.    Cardiovascular: Negative.   Gastrointestinal: Negative.   Genitourinary: Negative.   Musculoskeletal: Negative.   Skin: Negative.   Neurological: Negative.   Endo/Heme/Allergies: Negative.   Psychiatric/Behavioral: Negative.       Objective:   Vitals:   03/05/24 1645  BP: 118/70  Pulse: 67  Resp: 18  Temp: 98.5 F (36.9 C)  SpO2: 97%   Height: 5' 11.65" (182 cm)  Weight: 203 lb 1.6 oz (92.1 kg)   Physical Exam Constitutional:      Appearance: He is not diaphoretic.  HENT:     Head: Normocephalic.     Right Ear: Tympanic membrane, ear canal and external ear normal.     Left Ear: Tympanic membrane, ear canal and external ear normal.     Nose: Nose normal. No mucosal edema or rhinorrhea.     Mouth/Throat:     Pharynx: Uvula midline. No oropharyngeal exudate.  Eyes:     Conjunctiva/sclera: Conjunctivae normal.  Neck:     Thyroid: No thyromegaly.     Trachea: Trachea normal. No tracheal tenderness or tracheal deviation.  Cardiovascular:     Rate and Rhythm: Normal rate and regular rhythm.     Heart sounds: Normal heart sounds, S1 normal and S2 normal. No murmur heard. Pulmonary:     Effort: No respiratory distress.     Breath sounds: Normal breath sounds. No stridor. No wheezing or rales.  Lymphadenopathy:     Head:     Right side of head: No tonsillar adenopathy.     Left side of head: No tonsillar adenopathy.     Cervical: No cervical adenopathy.  Skin:    Findings: Rash (Multiple hyperpigmented macules and papules involving chest and upper arms with several possessing some erythema especially those involving his  anterior upper arm.  Hyperpigmented axilla without any obvious papules.  Clear beard area.) present. No erythema.     Nails: There is no clubbing.  Neurological:     Mental Status: He is alert.     Diagnostics: none   Assessment and Plan:   1. Asthma, moderate persistent, well-controlled   2. Perennial allergic rhinitis   3. Seasonal allergic rhinitis due to pollen   4. Seasonal allergic conjunctivitis   5. Other atopic dermatitis   6. Folliculitis barbae    1. Continue Symbicort  160 - 2 inhalations 1-2 times per day    2. Continue Flonase  1-2 sprays each nostril 1-2 time a day  3. Continue levocetirizine 5 mg tablet 1 tablet 1-2 time per day  4. If Needed:  A. Pataday  1 drop each eye  one time per day  B. Symbicort  - 2 inhalations every 6 hours C. ELIDEL  + Mometasone  0.1% ointment 1-2 times per day to body D. Hydrocortisone  2.5% cream to face - 1 time per day E. Mupirocin  - under arms 1 time per day F.  Hibiclens  or similar antibacterial cleaning agent  5. Never, ever rub or touch eyes  6. Sports glasses   7. Return to clinic in 6 months or sooner if problem  Pedro Tucker will use a collection of anti-inflammatory agents for his airway and for his skin as noted above in an attempt to treat his multiorgan atopic disease and recurrent folliculitis.  Unfortunately he did not have a good response to the use of immunotherapy in the past to treat his atopic disease and this is not really a viable therapeutic option at this point in time.  Will see how he does over the course of the next several months while utilizing the plan noted above.  If he does well I will see him back in this clinic in 6 months or earlier if there is a problem.  Schuyler Custard, MD Allergy / Immunology [facility]

## 2024-03-05 NOTE — Patient Instructions (Addendum)
  1. Continue Symbicort  160 - 2 inhalations 1-2 times per day    2. Continue Flonase  1-2 sprays each nostril 1-2 time a day  3. Continue levocetirizine 5 mg tablet 1 tablet 1-2 time per day  4. If Needed:  A. Pataday  1 drop each eye one time per day  B. Symbicort  - 2 inhalations every 6 hours C. ELIDEL  + Mometasone  0.1% ointment 1-2 times per day to body D. Hydrocortisone  2.5% cream to face - 1 time per day E. Mupirocin  - under arms 1 time per day F.  Hibiclens  or similar antibacterial cleaning agent  5. Never, ever rub or touch eyes  6. Sports glasses   7. Return to clinic in 6 months or sooner if problem

## 2024-03-06 ENCOUNTER — Encounter: Payer: Self-pay | Admitting: Allergy and Immunology

## 2024-03-29 ENCOUNTER — Other Ambulatory Visit: Payer: Self-pay

## 2024-03-29 MED ORDER — VENTOLIN HFA 108 (90 BASE) MCG/ACT IN AERS: 2.0000 | INHALATION_SPRAY | RESPIRATORY_TRACT | 1 refills | Status: DC | PRN

## 2024-04-23 ENCOUNTER — Other Ambulatory Visit: Payer: Self-pay | Admitting: Allergy and Immunology

## 2024-04-23 NOTE — Telephone Encounter (Signed)
 Refill for Ventolin  HFA x 1 with 2 refill sent to [facility].

## 2024-06-19 ENCOUNTER — Other Ambulatory Visit: Payer: Self-pay | Admitting: Allergy and Immunology

## 2024-07-19 ENCOUNTER — Other Ambulatory Visit: Payer: Self-pay | Admitting: Allergy and Immunology

## 2024-08-08 ENCOUNTER — Other Ambulatory Visit: Payer: Self-pay | Admitting: Allergy and Immunology

## 2024-09-09 ENCOUNTER — Other Ambulatory Visit: Payer: Self-pay | Admitting: Allergy and Immunology

## 2024-09-10 ENCOUNTER — Other Ambulatory Visit: Payer: Self-pay | Admitting: Allergy and Immunology

## 2024-09-10 DIAGNOSIS — J301 Allergic rhinitis due to pollen: Secondary | ICD-10-CM

## 2024-09-10 DIAGNOSIS — J4540 Moderate persistent asthma, uncomplicated: Secondary | ICD-10-CM

## 2024-09-10 DIAGNOSIS — J3089 Other allergic rhinitis: Secondary | ICD-10-CM

## 2024-10-08 ENCOUNTER — Other Ambulatory Visit: Payer: Self-pay | Admitting: Allergy and Immunology

## 2024-10-08 ENCOUNTER — Ambulatory Visit (INDEPENDENT_AMBULATORY_CARE_PROVIDER_SITE_OTHER): Admitting: Allergy and Immunology

## 2024-10-08 VITALS — BP 120/78 | Temp 98.2°F | Ht 72.0 in | Wt 205.4 lb

## 2024-10-08 DIAGNOSIS — J4540 Moderate persistent asthma, uncomplicated: Secondary | ICD-10-CM

## 2024-10-08 DIAGNOSIS — L2089 Other atopic dermatitis: Secondary | ICD-10-CM

## 2024-10-08 DIAGNOSIS — L739 Follicular disorder, unspecified: Secondary | ICD-10-CM

## 2024-10-08 DIAGNOSIS — H1010 Acute atopic conjunctivitis, unspecified eye: Secondary | ICD-10-CM

## 2024-10-08 DIAGNOSIS — J3089 Other allergic rhinitis: Secondary | ICD-10-CM | POA: Diagnosis not present

## 2024-10-08 DIAGNOSIS — H1013 Acute atopic conjunctivitis, bilateral: Secondary | ICD-10-CM | POA: Diagnosis not present

## 2024-10-08 MED ORDER — ELIDEL 1 % EX CREA: TOPICAL_CREAM | CUTANEOUS | 5 refills | Status: DC

## 2024-10-08 MED ORDER — FLUTICASONE PROPIONATE 50 MCG/ACT NA SUSP: 1.0000 | Freq: Every day | NASAL | 1 refills | Status: AC

## 2024-10-08 MED ORDER — LEVOCETIRIZINE DIHYDROCHLORIDE 5 MG PO TABS
5.0000 mg | ORAL_TABLET | Freq: Every day | ORAL | 1 refills | Status: AC | PRN
Start: 2024-10-08 — End: ?

## 2024-10-08 MED ORDER — MOMETASONE FUROATE 0.1 % EX OINT: TOPICAL_OINTMENT | Freq: Every day | CUTANEOUS | 5 refills | Status: AC

## 2024-10-08 MED ORDER — CHLORHEXIDINE GLUCONATE 4 % EX SOLN: Freq: Two times a day (BID) | CUTANEOUS | 1 refills | Status: AC

## 2024-10-08 MED ORDER — SYMBICORT 160-4.5 MCG/ACT IN AERO: 2.0000 | INHALATION_SPRAY | Freq: Two times a day (BID) | RESPIRATORY_TRACT | 1 refills | Status: AC

## 2024-10-08 MED ORDER — CLINDAMYCIN PHOSPHATE 1 % EX SWAB: 1.0000 | Freq: Two times a day (BID) | CUTANEOUS | 6 refills | Status: AC

## 2024-10-08 MED ORDER — OLOPATADINE HCL 0.2 % OP SOLN: 1.0000 [drp] | Freq: Every day | OPHTHALMIC | 2 refills | Status: AC | PRN

## 2024-10-08 MED ORDER — SYMBICORT 160-4.5 MCG/ACT IN AERO: 2.0000 | INHALATION_SPRAY | Freq: Four times a day (QID) | RESPIRATORY_TRACT | 1 refills | Status: DC | PRN

## 2024-10-08 MED ORDER — MUPIROCIN 2 % EX OINT: TOPICAL_OINTMENT | CUTANEOUS | 1 refills | Status: AC

## 2024-10-08 MED ORDER — HYDROCORTISONE 2.5 % EX CREA: TOPICAL_CREAM | Freq: Two times a day (BID) | CUTANEOUS | 1 refills | Status: AC

## 2024-10-08 NOTE — Patient Instructions (Addendum)
  1. Continue Symbicort  160 - 2 inhalations 1-2 times per day    2. Continue Flonase  1-2 sprays each nostril 1-2 time a day  3. Continue levocetirizine 5 mg tablet 1 tablet 1-2 time per day  4. Start Clindamycin  1% SWAB - use 2 swabs on chest every day (2 bottles. Refill x 6)  5. If Needed:  A. Pataday  1 drop each eye one time per day  B. Symbicort  - 2 inhalations every 6 hours C. ELIDEL  + Mometasone  0.1% ointment 1-2 times per day to body D. Hydrocortisone  2.5% cream to face - 1 time per day E. Mupirocin  - under arms 1 time per day F.  Hibiclens  or similar antibacterial cleaning agent  6. Never, ever rub or touch eyes. Sports glasses   7. Return to clinic in 6 months or sooner if problem  8. Influenza = Tamiflu. Covid = Paxlovid

## 2024-10-08 NOTE — Progress Notes (Unsigned)
 [place] - [place] - [place] - [place] - [place]   Follow-up Note  Referring Provider: Caswell Alstrom, MD Primary Provider: Caswell Alstrom, MD Date of Office Visit: 10/08/2024  Subjective:   Pedro Tucker (DOB: [birth year]) is a 18 y.o. male who [facility]24 in re-evaluation of the following:  HPI: Pedro Tucker returns to this clinic in evaluation of asthma, allergic rhinitis, atopic dermatitis, folliculitis.  I last saw him in this clinic 05 March 2024.  The big problem for no at this point in time is his folliculitis which has progressed down to his chest.  In the past he has been treated with clindamycin  swabs for his facial folliculitis which has really worked well in taking care of this issue but he has not used any clindamycin  swabs for his chest.  The rest of his skin condition tied up with his atopic disease is under very good control and he intermittently rarely uses any topical anti-inflammatory agents for his skin at this point in time.  And his airway has been doing very well.  He has had very little problems with his nose or his chest while he consistently uses his Symbicort  1 time per day and his Flonase  1 time per day.  He has not required a systemic steroid or an antibiotic for any type of airway issue since have last seen him in this clinic.  He rarely uses his Symbicort  and a rescue mode.  It is during the spring and the fall that he needs to increase his Symbicort  and Flonase .  Allergies as of 10/08/2024   No Known Allergies      Medication List    albuterol  (2.5 MG/3ML) 0.083% nebulizer solution Commonly known as: PROVENTIL  Take 3 mLs (2.5 mg total) by nebulization every 4 (four) hours as needed for wheezing or shortness of breath.   Ventolin  HFA 108 (90 Base) MCG/ACT inhaler Generic drug: albuterol  inhale 2 PUFFS into THE lungs EVERY 4 HOURS AS NEEDED FOR wheezing OR SHORTNESS OF BREATH   chlorhexidine  4 %  external liquid Commonly known as: Hibiclens  Apply topically in the morning and at bedtime.   clindamycin  1 % Swab Commonly known as: CLEOCIN  T Apply 1 each topically 2 (two) times daily.   Elidel  1 % cream Generic drug: pimecrolimus  1 application 1-2 times daily as needed to face.   EPINEPHrine  0.3 mg/0.3 mL Soaj injection Commonly known as: EpiPen  2-Pak Inject 0.3 mg into the muscle as needed for anaphylaxis.   fluticasone  110 MCG/ACT inhaler Commonly known as: FLOVENT  HFA Inhale 3 puffs into the lungs 3 times daily. Can inhale three puffs three times daily during asthma flare-up as directed. Rinse, gargle, and spit after use.   fluticasone  50 MCG/ACT nasal spray Commonly known as: FLONASE  Place 1-2 sprays into both nostrils daily.   hydrocortisone  2.5 % cream Apply topically 2 (two) times daily. Apply twice daily for flare ups above neck, maximum 7 days.   levocetirizine 5 MG tablet Commonly known as: XYZAL  Take 1 tablet (5 mg total) by mouth daily as needed for allergies (Can takre an extra dose during flare ups).   mometasone  0.1 % ointment Commonly known as: ELOCON  Apply topically daily.   montelukast  10 MG tablet Commonly known as: SINGULAIR  TAKE 1 TABLET BY MOUTH AT BEDTIME   mupirocin  ointment 2 % Commonly known as: BACTROBAN  APPLY TO AFFECTED AREA 3 TIMES DAILY FOR 1-2 WEEKS   Olopatadine  HCl 0.2 % Soln Commonly known  as: Pataday  Place 1 drop into both eyes daily as needed (Can use an extra drop during flare ups.).   Symbicort  160-4.5 MCG/ACT inhaler Generic drug: budesonide -formoterol  INHALE TWO PUFFS into THE lungs 2 TIMES DAILY    Past Medical History:  Diagnosis Date   Allergy    Asthma    Attention deficit hyperactivity disorder (ADHD)    Autism spectrum disorder    Eczema     Past Surgical History:  Procedure Laterality Date   ORCHIOPEXY      Review of systems negative except as noted in HPI / PMHx or noted below:  Review of Systems   Constitutional: Negative.   HENT: Negative.    Eyes: Negative.   Respiratory: Negative.    Cardiovascular: Negative.   Gastrointestinal: Negative.   Genitourinary: Negative.   Musculoskeletal: Negative.   Skin: Negative.   Neurological: Negative.   Endo/Heme/Allergies: Negative.   Psychiatric/Behavioral: Negative.       Objective:   Vitals:   10/08/24 0904  BP: 120/78  Temp: 98.2 F (36.8 C)   Height: 6' (182.9 cm)  Weight: 205 lb 6.4 oz (93.2 kg)   Physical Exam Constitutional:      Appearance: He is not diaphoretic.  HENT:     Head: Normocephalic.     Right Ear: Tympanic membrane, ear canal and external ear normal.     Left Ear: Tympanic membrane, ear canal and external ear normal.     Nose: Nose normal. No mucosal edema or rhinorrhea.     Mouth/Throat:     Pharynx: Uvula midline. No oropharyngeal exudate.  Eyes:     Conjunctiva/sclera: Conjunctivae normal.  Neck:     Thyroid: No thyromegaly.     Trachea: Trachea normal. No tracheal tenderness or tracheal deviation.  Cardiovascular:     Rate and Rhythm: Normal rate and regular rhythm.     Heart sounds: Normal heart sounds, S1 normal and S2 normal. No murmur heard. Pulmonary:     Effort: No respiratory distress.     Breath sounds: Normal breath sounds. No stridor. No wheezing or rales.  Lymphadenopathy:     Head:     Right side of head: No tonsillar adenopathy.     Left side of head: No tonsillar adenopathy.     Cervical: No cervical adenopathy.  Skin:    Findings: Rash (Multiple cystic/nodular hyperpigmented lesions anterior chest.) present. No erythema.     Nails: There is no clubbing.  Neurological:     Mental Status: He is alert.     Diagnostics: none  Assessment and Plan:   1. Asthma, moderate persistent, well-controlled   2. Seasonal allergic conjunctivitis   3. Perennial allergic rhinitis   4. Other atopic dermatitis   5. Chronic folliculitis     1. Continue Symbicort  160 - 2  inhalations 1-2 times per day    2. Continue Flonase  1-2 sprays each nostril 1-2 time a day  3. Continue levocetirizine 5 mg tablet 1 tablet 1-2 time per day  4. Start Clindamycin  1% SWAB - use 2 swabs on chest every day (2 bottles. Refill x 6)  5. If Needed:  A. Pataday  1 drop each eye one time per day  B. Symbicort  - 2 inhalations every 6 hours C. ELIDEL  + Mometasone  0.1% ointment 1-2 times per day to body D. Hydrocortisone  2.5% cream to face - 1 time per day E. Mupirocin  - under arms 1 time per day F.  Hibiclens  or similar antibacterial cleaning agent  6.  Never, ever rub or touch eyes. Sports glasses   7. Return to clinic in 6 months or sooner if problem  8. Influenza = Tamiflu. Covid = Paxlovid  Elin has a folliculitis that needs attention and will start him on clindamycin  swabs and assume that this will work well given his previous experience of successfully using these swabs on his face.  If he has difficulty with this plan his mom will contact me for further instructions.  Fortunately, his airway issue and his atopic dermatitis issue is under very good control at this time of the year while using Symbicort  and Flonase  1 time per day.  We need to see what happens as he goes through this upcoming springtime pollination season as that is the worst time of the year for him regarding his atopic disease.  I will see him back in this clinic in 6 months or earlier if there is a problem.  Camellia Denis, MD Allergy [facility] Immunology  [facility]

## 2024-10-09 ENCOUNTER — Encounter: Payer: Self-pay | Admitting: Allergy and Immunology

## 2024-10-14 ENCOUNTER — Other Ambulatory Visit: Payer: Self-pay | Admitting: Allergy and Immunology

## 2024-10-21 ENCOUNTER — Other Ambulatory Visit (HOSPITAL_COMMUNITY): Payer: Self-pay

## 2024-10-22 ENCOUNTER — Telehealth: Payer: Self-pay

## 2024-10-22 NOTE — Telephone Encounter (Signed)
*  AA  Pharmacy Patient Advocate Encounter   Received notification from Fax that prior authorization for Pimecrolimus  1% cream   is required/requested.   Insurance verification completed.   The patient is insured through [employer].   Per test claim: PA required; PA submitted to above mentioned insurance via Latent Key/confirmation #/EOC [id number] Status is pending

## 2024-10-22 NOTE — Telephone Encounter (Signed)
 Your request has been approved PA Case: [id number], Status: Approved, Coverage Starts on: 11/15/2023 12:00:00 AM, Coverage Ends on: 11/13/2025 12:00:00 AM. Questions? Contact [phone]. Authorization Expiration12/31/2026
